# Patient Record
Sex: Female | Born: 2000 | Race: White | Hispanic: No | Marital: Single | Smoking: Never smoker
Health system: Southern US, Community
[De-identification: ages and names within clinical notes are randomized; demographics above are authoritative.]

---
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
## 2018-06-19 IMAGING — US US ABDOMEN LIMITED
1 series · 14 of 25 positions shown · non-contrast
Comparison: None.

CLINICAL DATA: 15 y/o F; epigastric abdominal pain with nausea,
vomiting, and diarrhea. Elevated lipase.

EXAM:
US ABDOMEN LIMITED - RIGHT UPPER QUADRANT

[Series 1: us abdomen limited · 0.15mm/px · 14 of 39 slices shown]
[im 1/39]
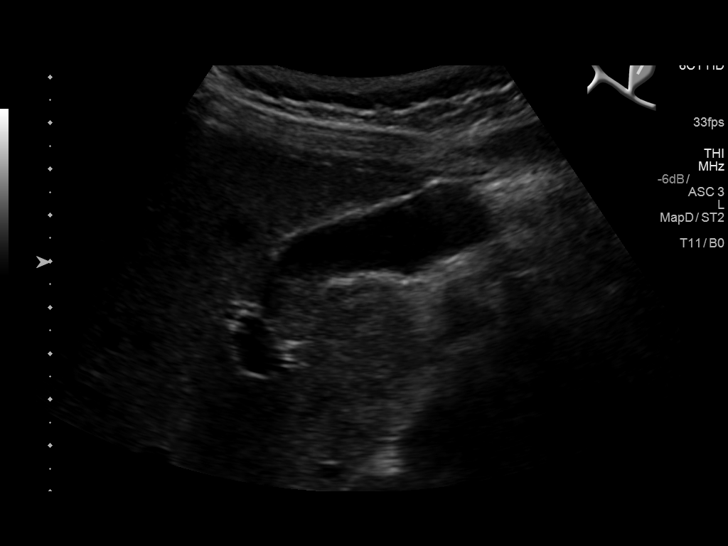
[im 4/39]
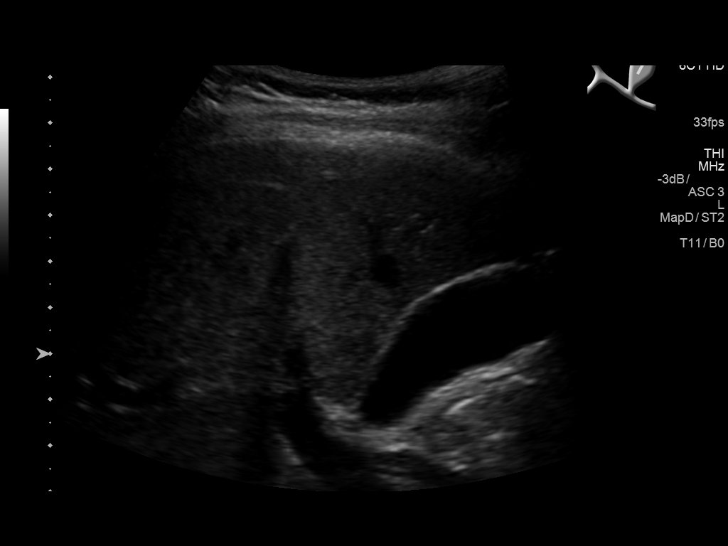
[im 7/39]
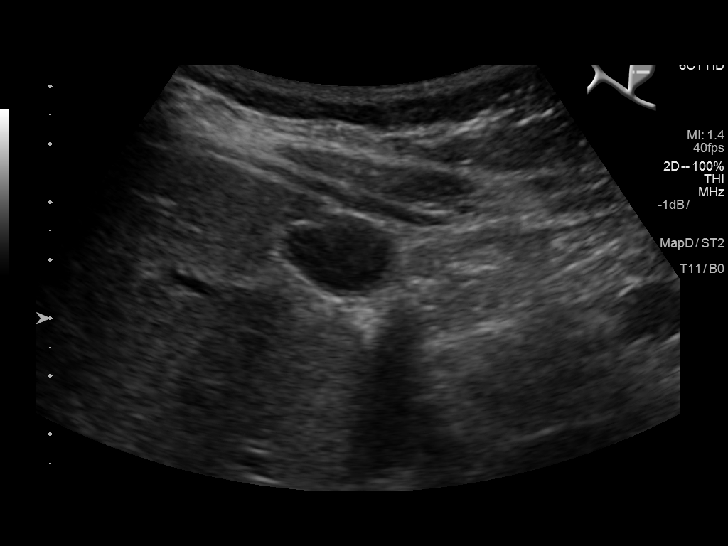
[im 10/39]
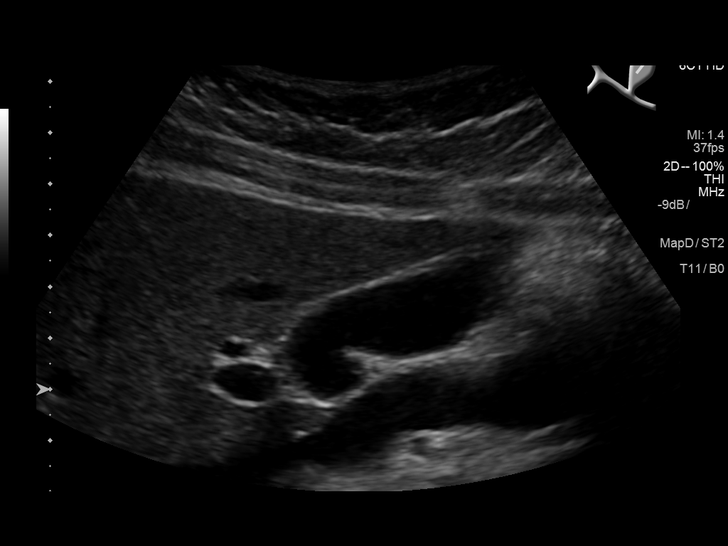
[im 13/39]
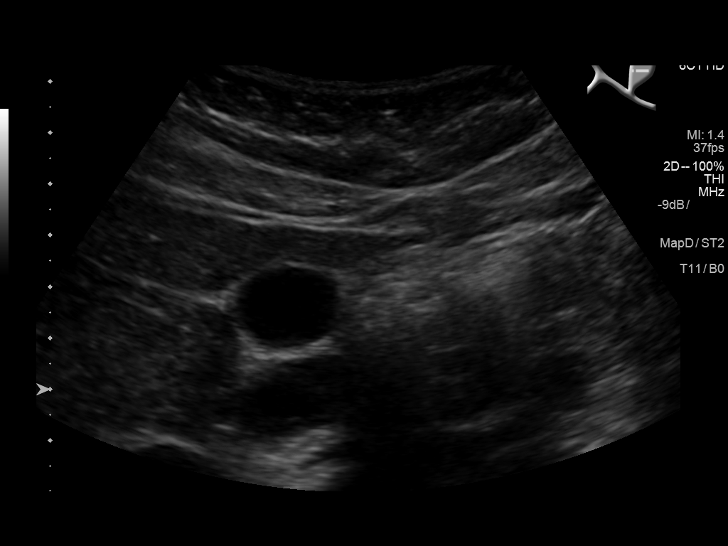
[im 15/39]
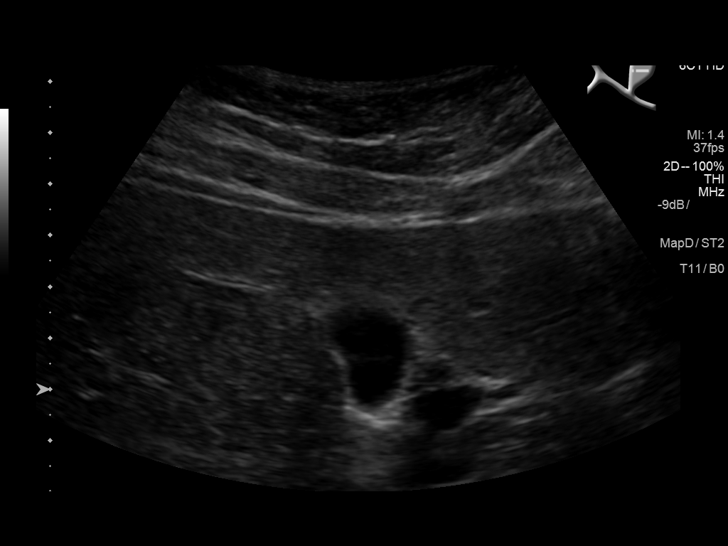
[im 18/39]
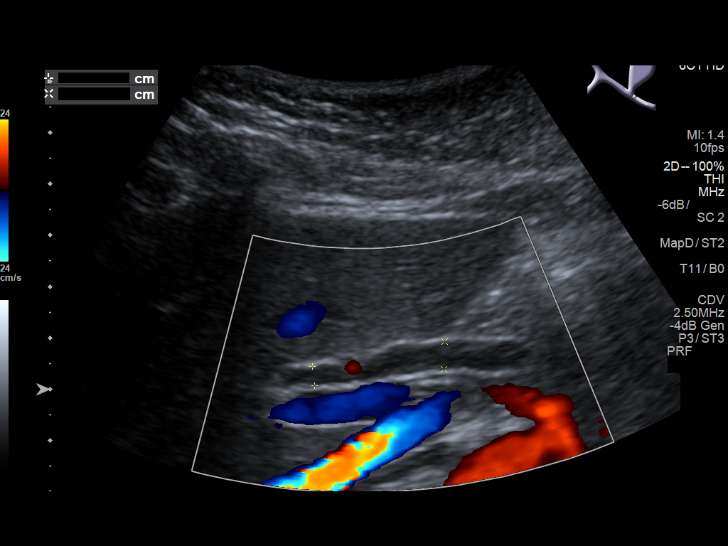
[im 21/39]
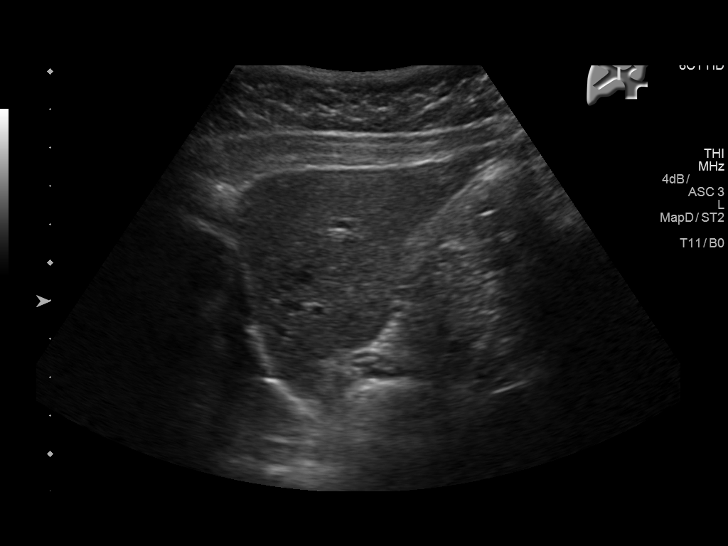
[im 24/39]
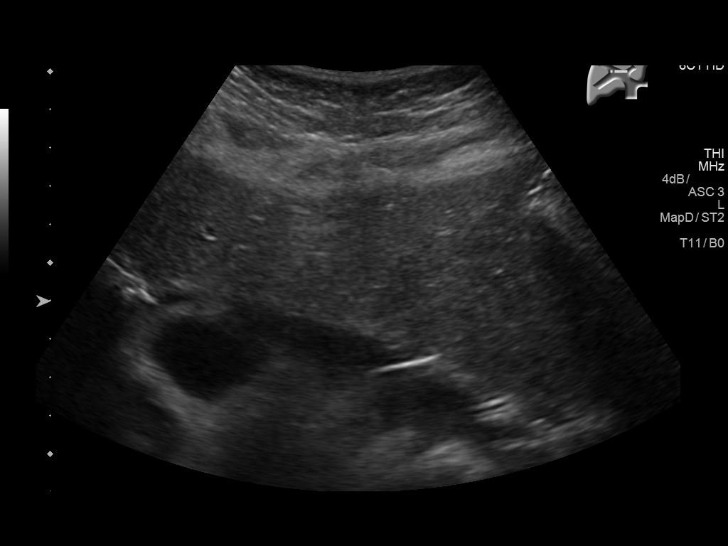
[im 26/39]
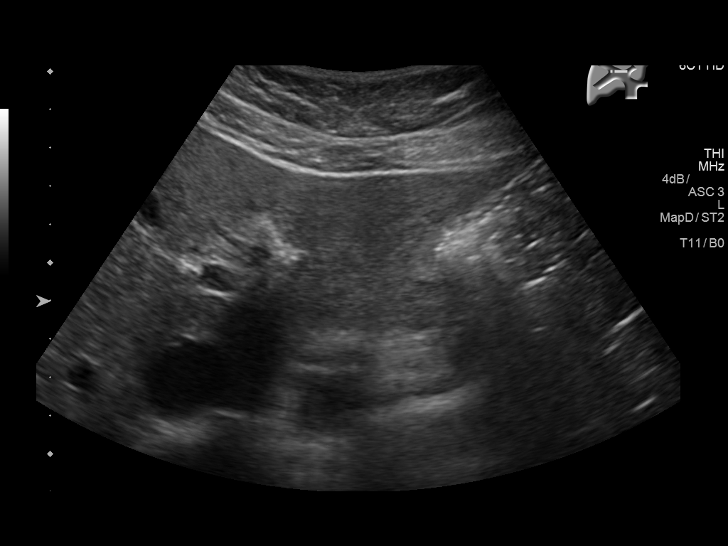
[im 29/39]
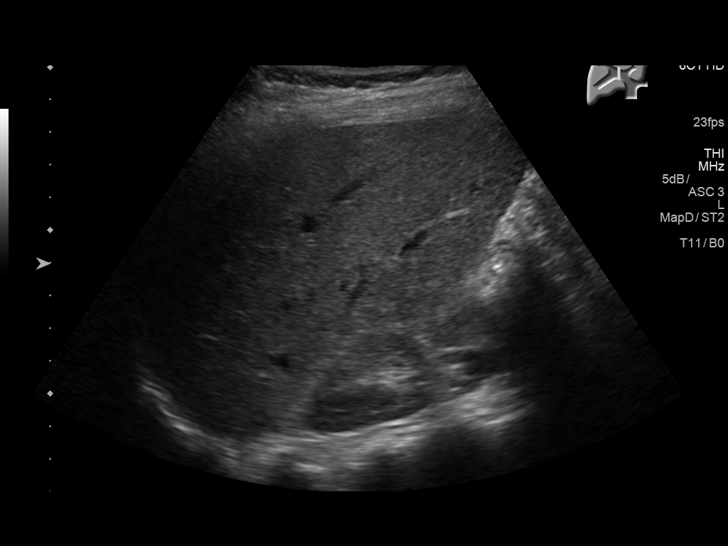
[im 32/39]
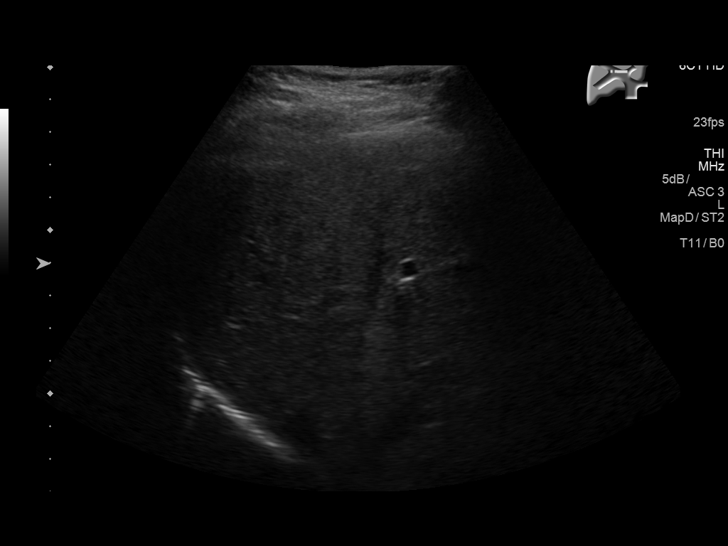
[im 35/39]
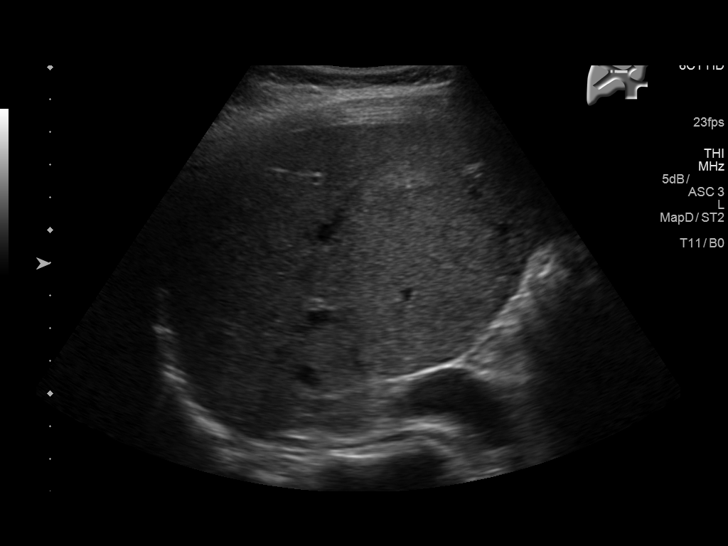
[im 39/39]
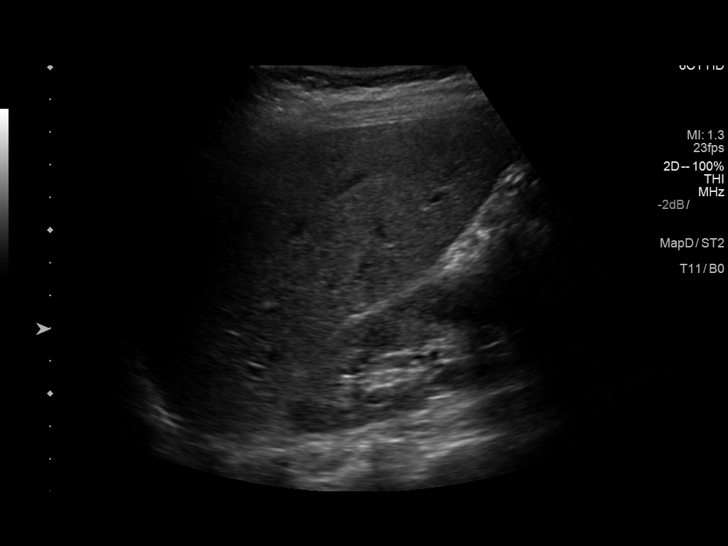

[14 of 25 positions shown; findings below may reference images not displayed]

FINDINGS: Gallbladder:

No gallstones or wall thickening visualized. No sonographic Murphy
sign noted by sonographer.

Common bile duct:

Diameter: 5.4 mm

Liver:

No focal lesion identified. Within normal limits in parenchymal
echogenicity.
IMPRESSION: Normal right upper quadrant ultrasound.

By: Sifi Ala M.D.

## 2020-06-25 ENCOUNTER — Ambulatory Visit (LOCAL_COMMUNITY_HEALTH_CENTER): Payer: Medicaid Other

## 2020-06-25 ENCOUNTER — Other Ambulatory Visit: Payer: Self-pay

## 2020-06-25 VITALS — BP 118/76 | Ht 62.0 in | Wt 97.0 lb

## 2020-06-25 DIAGNOSIS — Z3201 Encounter for pregnancy test, result positive: Secondary | ICD-10-CM

## 2020-06-25 LAB — PREGNANCY, URINE: Preg Test, Ur: POSITIVE — AB

## 2020-06-25 MED ORDER — PRENATAL 27-0.8 MG PO TABS
1.0000 | ORAL_TABLET | Freq: Every day | ORAL | 0 refills | Status: AC
Start: 2020-06-25 — End: 2020-10-03

## 2020-06-25 NOTE — Progress Notes (Signed)
UPT positive. Unsure where she plans prenatal care. Local prenatal resource list given. To DSS for Medicaid/preg women. Jerel Shepherd, RN

## 2020-07-17 LAB — OB RESULTS CONSOLE GC/CHLAMYDIA
Chlamydia: NEGATIVE
Gonorrhea: NEGATIVE

## 2020-08-16 LAB — OB RESULTS CONSOLE VARICELLA ZOSTER ANTIBODY, IGG: Varicella: IMMUNE

## 2020-08-16 LAB — OB RESULTS CONSOLE HEPATITIS B SURFACE ANTIGEN: Hepatitis B Surface Ag: NEGATIVE

## 2020-08-16 LAB — OB RESULTS CONSOLE RUBELLA ANTIBODY, IGM: Rubella: IMMUNE

## 2021-01-19 NOTE — L&D Delivery Note (Signed)
Delivery Note  First Stage: Labor onset: 02/12/21 @ 1539 Augmentation: cytotec, pitocin Analgesia Eliezer Lofts intrapartum: epidural SROM at 02/12/21 @ 1539  Second Stage: Complete dilation at 1040 Onset of pushing at 1058 FHR second stage Category II, intermittent late decelerations   Delivery of a viable female infant 02/13/2021 at 1105 by Donato Schultz, CNM delivery of fetal head in OA position with restitution to ROA. No nuchal cord;  right nuchal hand. Anterior then posterior shoulders delivered easily with gentle downward traction. Baby placed on mom's chest, and attended to by peds.  Cord double clamped after cessation of pulsation, cut by FOB Cord blood sample collected   Third Stage: Placenta delivered easily Tomasa Blase intact with 3 VC @ 1116 Placenta disposition: discarded Uterine tone firm with massage, pitocin, and Cytotec / bleeding heavy.  2nd degree right sulcal, left periurethral, and cervical laceration identified. Cervical laceration bleeding briskly. Called for Dr. Feliberto Gottron to repair cervical laceration, see his note. I repaired her 2nd degree right sulcal. Left periurethral hemostatic and approximated, not requiring sutures. Anesthesia for repair: epidural Repair 2-0 Vicryl Quantiative Blood Loss (mL):  Complications: cervical laceration, pre-eclampsia on magnesium, postpartum hemorrhage  STAT CBC ordered and repeat CBC in 4hrs.  Mom to stay in L&D for postpartum magnesium.  Baby to Couplet care / Skin to Skin.  Newborn: Birth Weight: will weigh infant after skin-to-skin Apgar Scores: 8, 9 Feeding planned: breast

## 2021-02-05 LAB — OB RESULTS CONSOLE GBS: GBS: NEGATIVE

## 2021-02-05 LAB — OB RESULTS CONSOLE HIV ANTIBODY (ROUTINE TESTING): HIV: NONREACTIVE

## 2021-02-05 LAB — OB RESULTS CONSOLE RPR: RPR: NONREACTIVE

## 2021-02-12 ENCOUNTER — Inpatient Hospital Stay
Admission: EM | Admit: 2021-02-12 | Discharge: 2021-02-15 | DRG: 768 | Disposition: A | Discharge status: Home / Self Care | Payer: Medicaid Other | Attending: Obstetrics and Gynecology | Admitting: Obstetrics and Gynecology

## 2021-02-12 ENCOUNTER — Encounter: Payer: Self-pay | Admitting: Obstetrics and Gynecology

## 2021-02-12 ENCOUNTER — Other Ambulatory Visit: Payer: Self-pay

## 2021-02-12 DIAGNOSIS — O365930 Maternal care for other known or suspected poor fetal growth, third trimester, not applicable or unspecified: Secondary | ICD-10-CM | POA: Diagnosis present

## 2021-02-12 DIAGNOSIS — O4103X1 Oligohydramnios, third trimester, fetus 1: Secondary | ICD-10-CM | POA: Diagnosis present

## 2021-02-12 DIAGNOSIS — Z20822 Contact with and (suspected) exposure to covid-19: Secondary | ICD-10-CM | POA: Diagnosis present

## 2021-02-12 DIAGNOSIS — Z6791 Unspecified blood type, Rh negative: Secondary | ICD-10-CM

## 2021-02-12 DIAGNOSIS — D500 Iron deficiency anemia secondary to blood loss (chronic): Secondary | ICD-10-CM | POA: Diagnosis not present

## 2021-02-12 DIAGNOSIS — D62 Acute posthemorrhagic anemia: Secondary | ICD-10-CM | POA: Diagnosis not present

## 2021-02-12 DIAGNOSIS — O9081 Anemia of the puerperium: Secondary | ICD-10-CM | POA: Diagnosis not present

## 2021-02-12 DIAGNOSIS — O4103X0 Oligohydramnios, third trimester, not applicable or unspecified: Principal | ICD-10-CM | POA: Diagnosis present

## 2021-02-12 DIAGNOSIS — Z3A39 39 weeks gestation of pregnancy: Secondary | ICD-10-CM

## 2021-02-12 DIAGNOSIS — O1414 Severe pre-eclampsia complicating childbirth: Secondary | ICD-10-CM | POA: Diagnosis present

## 2021-02-12 DIAGNOSIS — O26893 Other specified pregnancy related conditions, third trimester: Secondary | ICD-10-CM | POA: Diagnosis present

## 2021-02-12 DIAGNOSIS — S3763XA Laceration of uterus, initial encounter: Secondary | ICD-10-CM | POA: Diagnosis not present

## 2021-02-12 LAB — COMPREHENSIVE METABOLIC PANEL
ALT: 8 U/L (ref 0–44)
AST: 21 U/L (ref 15–41)
Albumin: 3 g/dL — ABNORMAL LOW (ref 3.5–5.0)
Alkaline Phosphatase: 218 U/L — ABNORMAL HIGH (ref 38–126)
Anion gap: 11 (ref 5–15)
BUN: 6 mg/dL (ref 6–20)
CO2: 23 mmol/L (ref 22–32)
Calcium: 8.6 mg/dL — ABNORMAL LOW (ref 8.9–10.3)
Chloride: 102 mmol/L (ref 98–111)
Creatinine, Ser: 0.51 mg/dL (ref 0.44–1.00)
GFR, Estimated: >60 mL/min (ref >60)
Glucose, Bld: 105 mg/dL — ABNORMAL HIGH (ref 70–99)
Potassium: 3.4 mmol/L — ABNORMAL LOW (ref 3.5–5.1)
Sodium: 136 mmol/L (ref 135–145)
Total Bilirubin: 0.4 mg/dL (ref 0.3–1.2)
Total Protein: 6.7 g/dL (ref 6.5–8.1)

## 2021-02-12 LAB — URINE DRUG SCREEN, QUALITATIVE (ARMC ONLY)
Amphetamines, Ur Screen: NOT DETECTED
Barbiturates, Ur Screen: NOT DETECTED
Benzodiazepine, Ur Scrn: NOT DETECTED
Cannabinoid 50 Ng, Ur ~~LOC~~: POSITIVE — AB
Cocaine Metabolite,Ur ~~LOC~~: NOT DETECTED
MDMA (Ecstasy)Ur Screen: NOT DETECTED
Methadone Scn, Ur: NOT DETECTED
Opiate, Ur Screen: NOT DETECTED
Phencyclidine (PCP) Ur S: NOT DETECTED
Tricyclic, Ur Screen: NOT DETECTED

## 2021-02-12 LAB — PROTEIN / CREATININE RATIO, URINE
Creatinine, Urine: 87 mg/dL
Protein Creatinine Ratio: 0.3 mg/mg{Cre} — ABNORMAL HIGH (ref 0.00–0.15)
Total Protein, Urine: 26 mg/dL

## 2021-02-12 LAB — CBC
HCT: 30.7 % — ABNORMAL LOW (ref 36.0–46.0)
Hemoglobin: 10.7 g/dL — ABNORMAL LOW (ref 12.0–15.0)
MCH: 33.1 pg (ref 26.0–34.0)
MCHC: 34.9 g/dL (ref 30.0–36.0)
MCV: 95 fL (ref 80.0–100.0)
Platelets: 261 10*3/uL (ref 150–400)
RBC: 3.23 MIL/uL — ABNORMAL LOW (ref 3.87–5.11)
RDW: 12.7 % (ref 11.5–15.5)
WBC: 12.4 10*3/uL — ABNORMAL HIGH (ref 4.0–10.5)
nRBC: 0 % (ref 0.0–0.2)

## 2021-02-12 LAB — RESP PANEL BY RT-PCR (FLU A&B, COVID) ARPGX2
Influenza A by PCR: NEGATIVE
Influenza B by PCR: NEGATIVE
SARS Coronavirus 2 by RT PCR: NEGATIVE

## 2021-02-12 LAB — ABO/RH: ABO/RH(D): O NEG

## 2021-02-12 MED ORDER — AMMONIA AROMATIC IN INHA
RESPIRATORY_TRACT | Status: AC
  Filled 2021-02-12: qty 10

## 2021-02-12 MED ORDER — OXYTOCIN 10 UNIT/ML IJ SOLN
INTRAMUSCULAR | Status: AC
  Filled 2021-02-12: qty 2

## 2021-02-12 MED ORDER — OXYTOCIN BOLUS FROM INFUSION
333.0000 mL | Freq: Once | INTRAVENOUS | Status: AC
  Administered 2021-02-13: 333 mL via INTRAVENOUS

## 2021-02-12 MED ORDER — LABETALOL HCL 5 MG/ML IV SOLN: 80.0000 mg | INTRAVENOUS | Status: DC | PRN

## 2021-02-12 MED ORDER — OXYTOCIN-SODIUM CHLORIDE 30-0.9 UT/500ML-% IV SOLN
2.5000 [IU]/h | INTRAVENOUS | Status: DC
  Administered 2021-02-13: 2.5 [IU]/h via INTRAVENOUS
  Filled 2021-02-12: qty 500

## 2021-02-12 MED ORDER — ACETAMINOPHEN 325 MG PO TABS: 650.0000 mg | ORAL_TABLET | ORAL | Status: DC | PRN

## 2021-02-12 MED ORDER — LABETALOL HCL 5 MG/ML IV SOLN: 20.0000 mg | INTRAVENOUS | Status: DC | PRN

## 2021-02-12 MED ORDER — MISOPROSTOL 200 MCG PO TABS
ORAL_TABLET | ORAL | Status: AC
  Filled 2021-02-12: qty 4

## 2021-02-12 MED ORDER — MISOPROSTOL 25 MCG QUARTER TABLET
ORAL_TABLET | ORAL | Status: AC
  Filled 2021-02-12: qty 1

## 2021-02-12 MED ORDER — HYDRALAZINE HCL 20 MG/ML IJ SOLN: 10.0000 mg | INTRAMUSCULAR | Status: DC | PRN

## 2021-02-12 MED ORDER — LIDOCAINE HCL (PF) 1 % IJ SOLN: 30.0000 mL | INTRAMUSCULAR | Status: DC | PRN

## 2021-02-12 MED ORDER — SOD CITRATE-CITRIC ACID 500-334 MG/5ML PO SOLN: 30.0000 mL | ORAL | Status: DC | PRN

## 2021-02-12 MED ORDER — LABETALOL HCL 5 MG/ML IV SOLN: 40.0000 mg | INTRAVENOUS | Status: DC | PRN

## 2021-02-12 MED ORDER — MAGNESIUM SULFATE BOLUS VIA INFUSION
4.0000 g | Freq: Once | INTRAVENOUS | Status: AC
  Administered 2021-02-12: 20:00:00 4 g via INTRAVENOUS
  Filled 2021-02-12: qty 1000

## 2021-02-12 MED ORDER — LACTATED RINGERS IV SOLN: INTRAVENOUS | Status: DC

## 2021-02-12 MED ORDER — MAGNESIUM SULFATE 40 GM/1000ML IV SOLN
2.0000 g/h | INTRAVENOUS | Status: DC
  Administered 2021-02-12 – 2021-02-13 (×2): 2 g/h via INTRAVENOUS
  Filled 2021-02-12 (×2): qty 1000

## 2021-02-12 MED ORDER — FENTANYL CITRATE (PF) 100 MCG/2ML IJ SOLN
50.0000 ug | INTRAMUSCULAR | Status: DC | PRN
  Administered 2021-02-12: 20:00:00 50 ug via INTRAVENOUS
  Filled 2021-02-12: qty 2

## 2021-02-12 MED ORDER — TERBUTALINE SULFATE 1 MG/ML IJ SOLN: 0.2500 mg | Freq: Once | INTRAMUSCULAR | Status: DC | PRN

## 2021-02-12 MED ORDER — FENTANYL CITRATE (PF) 100 MCG/2ML IJ SOLN
INTRAMUSCULAR | Status: AC
  Administered 2021-02-13: 50 ug via INTRAVENOUS
  Filled 2021-02-12: qty 2

## 2021-02-12 MED ORDER — OXYTOCIN-SODIUM CHLORIDE 30-0.9 UT/500ML-% IV SOLN
1.0000 m[IU]/min | INTRAVENOUS | Status: DC
  Administered 2021-02-12 (×2): 2 m[IU]/min via INTRAVENOUS
  Filled 2021-02-12: qty 500

## 2021-02-12 MED ORDER — BUTORPHANOL TARTRATE 1 MG/ML IJ SOLN
INTRAMUSCULAR | Status: AC
  Filled 2021-02-12: qty 1

## 2021-02-12 MED ORDER — MISOPROSTOL 25 MCG QUARTER TABLET
25.0000 ug | ORAL_TABLET | ORAL | Status: DC | PRN
  Administered 2021-02-12: 14:00:00 25 ug via VAGINAL

## 2021-02-12 MED ORDER — LACTATED RINGERS IV SOLN
500.0000 mL | INTRAVENOUS | Status: DC | PRN
  Administered 2021-02-12 – 2021-02-13 (×2): 500 mL via INTRAVENOUS

## 2021-02-12 MED ORDER — ONDANSETRON HCL 4 MG/2ML IJ SOLN
4.0000 mg | Freq: Four times a day (QID) | INTRAMUSCULAR | Status: DC | PRN
  Administered 2021-02-12 – 2021-02-13 (×3): 4 mg via INTRAVENOUS
  Filled 2021-02-12 (×3): qty 2

## 2021-02-12 NOTE — Progress Notes (Signed)
Labor Progress Note  Destiny Chandler is a 21 y.o. G1P0000 at [redacted]w[redacted]d by LMP admitted for IOL for Oligohydramnios  Subjective: Pt has requested IV pain medication  Objective: BP (!) 146/88 (BP Location: Right Arm)    Pulse 78    Temp 98.7 F (37.1 C) (Oral)    Resp 18    Ht 5\' 2"  (1.575 m)    Wt 62.6 kg    LMP 05/10/2020 (Exact Date)    BMI 25.24 kg/m   Fetal Assessment: FHT:  FHR: 130 bpm, variability: moderate,  accelerations:  Present,  decelerations:  Absent Category/reactivity:  Category I UC:   regular, every 2-3 minutes SVE:    Dilation: 1.5cm  Effacement: 60%  Station:  -3  Consistency:   Position: posterior  Membrane status: SROM at 1545 Amniotic color: Clear  Labs: Lab Results  Component Value Date   WBC 12.4 (H) 02/12/2021   HGB 10.7 (L) 02/12/2021   HCT 30.7 (L) 02/12/2021   MCV 95.0 02/12/2021   PLT 261 02/12/2021    Assessment / Plan: Induction of labor due to oligohydramnios 1420 59mcg Cytotec PV given 1545 SROM 1819 1/50/-2 1940 IUPC placed 1955 Mag Sulfate started 2014 1.5/60/-3 2015 43mcg Fentanyl given 2136 Pitocin started - MVUs at 130-170   Labor: Progressing normally Preeclampsia:  Magnesium Sulfate, 103-146 / 88-90, no signs or symptoms of toxicity Fetal Wellbeing:  Category I Pain Control:  IV pain meds I/D:   Afebrile, GBS neg, SROM x 5 hrs Anticipated MOD:  NSVD  Clydene Laming, CNM 02/12/2021, 9:44 PM

## 2021-02-12 NOTE — Progress Notes (Signed)
Labor Progress Note  Destiny Chandler is a 21 y.o. G1P0000 at [redacted]w[redacted]d by LMP admitted for IOL for Oligohydramnios  Subjective: Pt reports stronger contractions now and is having to breath through them.  Objective: BP (!) 139/94 (BP Location: Right Arm)    Pulse 71    Temp 98.1 F (36.7 C) (Oral)    Resp 18    Ht 5\' 2"  (1.575 m)    Wt 62.6 kg    LMP 05/10/2020 (Exact Date)    BMI 25.24 kg/m   Fetal Assessment: FHT:  FHR: 135 bpm, variability: moderate,  accelerations:  Present,  decelerations:  Absent Category/reactivity:  Category I UC:   regular, every 1-2 minutes SVE:    Dilation: 1cm  Effacement: 50%  Station:  -2  Consistency: medium  Position: posterior  Membrane status: SROM at 1545 Amniotic color: Clear  Labs: Lab Results  Component Value Date   WBC 12.4 (H) 02/12/2021   HGB 10.7 (L) 02/12/2021   HCT 30.7 (L) 02/12/2021   MCV 95.0 02/12/2021   PLT 261 02/12/2021    Assessment / Plan: Induction of labor due to oligohydramnios 1420 02/14/2021 Cytotec PV given 1545 SROM C/w Dr. - will start mag IUPC placed   Labor:  Pitocin was not started d/t contraction pattern; IUPC placed and will monitor labor progress Preeclampsia:  Magnesium started, 139-160 / 81-95, PCR 300 all other labs WNL, no signs or symptoms of toxicity Fetal Wellbeing:  Category I Pain Control:  Labor support without medications I/D:   Afebrile, GBS neg, SROM x 3 hrs Anticipated MOD:  NSVD  Feliberto Gottron, CNM 02/12/2021, 7:44 PM

## 2021-02-12 NOTE — Progress Notes (Signed)
Labor Progress Note  Destiny Chandler is a 21 y.o. G1P0000 at [redacted]w[redacted]d by LMP admitted for IOL for Oligohydramnios  Subjective: Pt denies pain.  Objective: BP (!) 149/85 (BP Location: Left Arm)    Pulse 75    Temp 98 F (36.7 C) (Oral)    Resp 18    Ht 5\' 2"  (1.575 m)    Wt 62.6 kg    LMP 05/10/2020 (Exact Date)    BMI 25.24 kg/m   Fetal Assessment: FHT:  FHR: 135 bpm, variability: moderate,  accelerations:  Present,  decelerations:  Absent Category/reactivity:  Category I UC:   regular, every 2-3 minutes, mild SVE:    Dilation: 1cm  Effacement: 50%  Station:  -2  Consistency: medium  Position: posterior  Membrane status: SROM at 1545 Amniotic color: Clear  Labs: Lab Results  Component Value Date   WBC 12.4 (H) 02/12/2021   HGB 10.7 (L) 02/12/2021   HCT 30.7 (L) 02/12/2021   MCV 95.0 02/12/2021   PLT 261 02/12/2021    Assessment / Plan: Induction of labor due to oligohydramnios 1420 68mcg Cytotec PV given  Labor:  Pitocin started Preeclampsia:  142-157 / 81-94, labs collected, no signs or symptoms of toxicity Fetal Wellbeing:  Category I Pain Control:  Labor support without medications I/D:   Afebrile, GBS neg, SROM x 2 hrs Anticipated MOD:  NSVD  Clydene Laming, CNM 02/12/2021, 6:57 PM

## 2021-02-12 NOTE — Progress Notes (Signed)
Just spoke with RN - soon after starting pitocin pt became tachysystole with a variable decel noted on FHT, baby recovered quickly to baseline with moderate variability. At time of decel RN turned pitocin off.  Will plan to give bolus and restart pitocin in 1 hr if reasonable.  Haroldine Laws 02/12/2021 10:00 PM

## 2021-02-12 NOTE — H&P (Signed)
OB History & Physical   History of Present Illness:  Chief Complaint:   HPI:  [place] B Noecker is a 21 y.o. G1P0000 female at [redacted]w[redacted]d dated by LMP.  She presents to L&D for IOL due to oligohydramnios.  AFI 3.69cm <3% and MVP 2.45cm  She reports:  -active fetal movement -no leakage of fluid -no vaginal bleeding -no contractions  Pregnancy Issues: 1. Rh Neg 2. Anemia, Hgb 10.4 at 30wks 3. Tobacco use in pregnancy   Maternal Medical History:   Past Medical History:  Diagnosis Date   Ovarian cyst 2019   resolved without treatment    Past Surgical History:  Procedure Laterality Date   TOOTH EXTRACTION  2012    Allergies  Allergen Reactions   Codeine Hives    Prior to Admission medications   Medication Sig Start Date End Date Taking? Authorizing Provider  ferrous sulfate 325 (65 FE) MG EC tablet Take 325 mg by mouth 3 (three) times daily with meals.   Yes [provider]  Prenatal Vit-Fe Fumarate-FA (PRENATAL MULTIVITAMIN) TABS tablet Take 1 tablet by mouth daily at 12 noon.   Yes [provider]  cephALEXin (KEFLEX) 500 MG capsule Take 1 capsule (500 mg total) by mouth 3 (three) times daily. Patient not taking: Reported on 06/25/2020 03/25/16   Loleta Rose, MD  ondansetron [facility]) 4 MG tablet Take 1-2 tabs by mouth every 8 hours as needed for nausea/vomiting Patient not taking: Reported on 06/25/2020 03/25/16   Loleta Rose, MD     Prenatal care site: [facility]   Social History: She  reports that she has never smoked. She has never used smokeless tobacco. She reports that she does not currently use alcohol. She reports that she does not use drugs.  Family History: family history is not on file.   Review of Systems: A full review of systems was performed and negative except as noted in the HPI.    Physical Exam:  Vital Signs: BP (!) 143/81 (BP Location: Left Arm)    Pulse 86    Temp 98.8 F (37.1 C) (Oral)    Resp 18    Ht 5\' 2"  (1.575 m)     Wt 62.6 kg    LMP 05/10/2020 (Exact Date)    BMI 25.24 kg/m   General:   alert and cooperative  Skin:  normal  Neurologic:    Alert & oriented x 3  Lungs:    Nl effort  Heart:   regular rate and rhythm  Abdomen:  soft, non-tender; bowel sounds normal; no masses,  no organomegaly  Extremities: : non-tender, symmetric, no edema bilaterally.      Results for orders placed or performed during the hospital encounter of 02/12/21 (from the past 24 hour(s))  CBC     Status: Abnormal   Collection Time: 02/12/21  1:38 PM  Result Value Ref Range   WBC 12.4 (H) 4.0 - 10.5 K/uL   RBC 3.23 (L) 3.87 - 5.11 MIL/uL   Hemoglobin 10.7 (L) 12.0 - 15.0 g/dL   HCT 02/14/21 (L) 78.2 - 95.6 %   MCV 95.0 80.0 - 100.0 fL   MCH 33.1 26.0 - 34.0 pg   MCHC 34.9 30.0 - 36.0 g/dL   RDW 21.3 08.6 - 57.8 %   Platelets 261 150 - 400 K/uL   nRBC 0.0 0.0 - 0.2 %    Pertinent Results:  Prenatal Labs: Blood type/Rh O neg  Antibody screen neg  Rubella Immune  Varicella  Immune  RPR NR  HBsAg Neg  HIV NR  GC neg  Chlamydia neg  Genetic screening negative  1 hour GTT 112  3 hour GTT   GBS Neg   FHT: FHR: 135 bpm, variability: moderate,  accelerations:  Present,  decelerations:  Absent Category/reactivity:  Category I TOCO: none SVE: Dilation: Fingertip / Effacement (%): 50 / Station: -2     Assessment:  [place] B Zacher is a 21 y.o. G89P0000 female at [redacted]w[redacted]d with oligohydramnios.   Plan:  1. Admit to Labor & Delivery; consents reviewed and obtained  2. Fetal Well being  - Fetal Tracing: Cat I - GBS neg - Presentation: vtx confirmed by SVE   3. Routine OB: - Prenatal labs reviewed, as above - Rh neg - CBC & T&S on admit - Clear fluids, IVF  4. Induction of Labor -  Contractions by external toco in place -  Plan for induction with Cytotec -  Plan for continuous fetal monitoring  -  Maternal pain control as desired: IVPM, nitrous, regional anesthesia - Anticipate vaginal delivery  5. Post Partum  Planning: - Infant feeding: Breastfeeding - Contraception: POPs - Tdap: given 11/25/20  - Flu: Declined  Arisha Gervais, CNM 02/12/2021 2:28 PM

## 2021-02-13 ENCOUNTER — Encounter: Payer: Self-pay | Admitting: Obstetrics and Gynecology

## 2021-02-13 ENCOUNTER — Inpatient Hospital Stay: Payer: Medicaid Other | Admitting: Anesthesiology

## 2021-02-13 LAB — CBC
HCT: 27.4 % — ABNORMAL LOW (ref 36.0–46.0)
HCT: 24.2 % — ABNORMAL LOW (ref 36.0–46.0)
HCT: 22.9 % — ABNORMAL LOW (ref 36.0–46.0)
Hemoglobin: 9.5 g/dL — ABNORMAL LOW (ref 12.0–15.0)
Hemoglobin: 8.3 g/dL — ABNORMAL LOW (ref 12.0–15.0)
Hemoglobin: 7.9 g/dL — ABNORMAL LOW (ref 12.0–15.0)
MCH: 33.8 pg (ref 26.0–34.0)
MCH: 33.7 pg (ref 26.0–34.0)
MCH: 33.5 pg (ref 26.0–34.0)
MCHC: 34.7 g/dL (ref 30.0–36.0)
MCHC: 34.3 g/dL (ref 30.0–36.0)
MCHC: 34.5 g/dL (ref 30.0–36.0)
MCV: 97.5 fL (ref 80.0–100.0)
MCV: 98.4 fL (ref 80.0–100.0)
MCV: 97 fL (ref 80.0–100.0)
Platelets: 262 10*3/uL (ref 150–400)
Platelets: 201 10*3/uL (ref 150–400)
Platelets: 248 10*3/uL (ref 150–400)
RBC: 2.81 MIL/uL — ABNORMAL LOW (ref 3.87–5.11)
RBC: 2.46 MIL/uL — ABNORMAL LOW (ref 3.87–5.11)
RBC: 2.36 MIL/uL — ABNORMAL LOW (ref 3.87–5.11)
RDW: 12.7 % (ref 11.5–15.5)
RDW: 12.9 % (ref 11.5–15.5)
RDW: 12.7 % (ref 11.5–15.5)
WBC: 28.1 10*3/uL — ABNORMAL HIGH (ref 4.0–10.5)
WBC: 24.8 10*3/uL — ABNORMAL HIGH (ref 4.0–10.5)
WBC: 21.5 10*3/uL — ABNORMAL HIGH (ref 4.0–10.5)
nRBC: 0 % (ref 0.0–0.2)
nRBC: 0 % (ref 0.0–0.2)
nRBC: 0 % (ref 0.0–0.2)

## 2021-02-13 LAB — RPR: RPR Ser Ql: NONREACTIVE

## 2021-02-13 LAB — MAGNESIUM: Magnesium: 5.5 mg/dL — ABNORMAL HIGH (ref 1.7–2.4)

## 2021-02-13 MED ORDER — FENTANYL-BUPIVACAINE-NACL 0.5-0.125-0.9 MG/250ML-% EP SOLN
EPIDURAL | Status: AC
  Filled 2021-02-13: qty 250

## 2021-02-13 MED ORDER — SODIUM CHLORIDE 0.9 % IV SOLN
300.0000 mg | Freq: Once | INTRAVENOUS | Status: DC
  Filled 2021-02-13: qty 15

## 2021-02-13 MED ORDER — FERROUS SULFATE 325 (65 FE) MG PO TABS
325.0000 mg | ORAL_TABLET | Freq: Two times a day (BID) | ORAL | Status: DC
  Administered 2021-02-14 – 2021-02-15 (×3): 325 mg via ORAL
  Filled 2021-02-13 (×3): qty 1

## 2021-02-13 MED ORDER — MISOPROSTOL 200 MCG PO TABS
800.0000 ug | ORAL_TABLET | Freq: Once | ORAL | Status: AC
  Administered 2021-02-13: 800 ug via RECTAL

## 2021-02-13 MED ORDER — ACETAMINOPHEN 325 MG PO TABS
650.0000 mg | ORAL_TABLET | ORAL | Status: DC | PRN
  Administered 2021-02-13 – 2021-02-14 (×3): 650 mg via ORAL
  Filled 2021-02-13 (×3): qty 2

## 2021-02-13 MED ORDER — BENZOCAINE-MENTHOL 20-0.5 % EX AERO
1.0000 "application " | INHALATION_SPRAY | CUTANEOUS | Status: DC | PRN
  Administered 2021-02-13 – 2021-02-15 (×2): 1 via TOPICAL
  Filled 2021-02-13 (×2): qty 56

## 2021-02-13 MED ORDER — PHENYLEPHRINE 40 MCG/ML (10ML) SYRINGE FOR IV PUSH (FOR BLOOD PRESSURE SUPPORT): 80.0000 ug | PREFILLED_SYRINGE | INTRAVENOUS | Status: DC | PRN

## 2021-02-13 MED ORDER — TRANEXAMIC ACID-NACL 1000-0.7 MG/100ML-% IV SOLN
INTRAVENOUS | Status: AC
  Administered 2021-02-13: 1000 mg via INTRAVENOUS
  Filled 2021-02-13: qty 100

## 2021-02-13 MED ORDER — FENTANYL-BUPIVACAINE-NACL 0.5-0.125-0.9 MG/250ML-% EP SOLN
EPIDURAL | Status: DC | PRN
  Administered 2021-02-13: 12 mL/h via EPIDURAL

## 2021-02-13 MED ORDER — SODIUM CHLORIDE 0.9 % IV SOLN
INTRAVENOUS | Status: DC | PRN
  Administered 2021-02-13 (×2): 5 mL via EPIDURAL

## 2021-02-13 MED ORDER — LACTATED RINGERS IV BOLUS
500.0000 mL | Freq: Once | INTRAVENOUS | Status: AC
  Administered 2021-02-13: 500 mL via INTRAVENOUS

## 2021-02-13 MED ORDER — LACTATED RINGERS IV SOLN: INTRAVENOUS | Status: DC

## 2021-02-13 MED ORDER — ACETAMINOPHEN 325 MG PO TABS
650.0000 mg | ORAL_TABLET | Freq: Once | ORAL | Status: AC
  Administered 2021-02-13: 650 mg via ORAL
  Filled 2021-02-13: qty 2

## 2021-02-13 MED ORDER — COCONUT OIL OIL
1.0000 "application " | TOPICAL_OIL | Status: DC | PRN
  Filled 2021-02-13: qty 120

## 2021-02-13 MED ORDER — PRENATAL MULTIVITAMIN CH
1.0000 | ORAL_TABLET | Freq: Every day | ORAL | Status: DC
  Administered 2021-02-14: 1 via ORAL

## 2021-02-13 MED ORDER — ONDANSETRON HCL 4 MG/2ML IJ SOLN: 4.0000 mg | INTRAMUSCULAR | Status: DC | PRN

## 2021-02-13 MED ORDER — WITCH HAZEL-GLYCERIN EX PADS
1.0000 "application " | MEDICATED_PAD | CUTANEOUS | Status: DC | PRN
  Administered 2021-02-13 – 2021-02-15 (×2): 1 via TOPICAL
  Filled 2021-02-13 (×2): qty 100

## 2021-02-13 MED ORDER — SODIUM CHLORIDE 0.9 % IV SOLN
300.0000 mg | Freq: Once | INTRAVENOUS | Status: AC
  Administered 2021-02-13: 300 mg via INTRAVENOUS
  Filled 2021-02-13: qty 15

## 2021-02-13 MED ORDER — SIMETHICONE 80 MG PO CHEW: 80.0000 mg | CHEWABLE_TABLET | ORAL | Status: DC | PRN

## 2021-02-13 MED ORDER — ONDANSETRON HCL 4 MG PO TABS: 4.0000 mg | ORAL_TABLET | ORAL | Status: DC | PRN

## 2021-02-13 MED ORDER — ZOLPIDEM TARTRATE 5 MG PO TABS: 5.0000 mg | ORAL_TABLET | Freq: Every evening | ORAL | Status: DC | PRN

## 2021-02-13 MED ORDER — TETANUS-DIPHTH-ACELL PERTUSSIS 5-2.5-18.5 LF-MCG/0.5 IM SUSY
0.5000 mL | PREFILLED_SYRINGE | Freq: Once | INTRAMUSCULAR | Status: DC
  Filled 2021-02-13: qty 0.5

## 2021-02-13 MED ORDER — IBUPROFEN 600 MG PO TABS
600.0000 mg | ORAL_TABLET | Freq: Four times a day (QID) | ORAL | Status: DC
  Administered 2021-02-13 – 2021-02-15 (×7): 600 mg via ORAL
  Filled 2021-02-13 (×6): qty 1

## 2021-02-13 MED ORDER — DIPHENHYDRAMINE HCL 25 MG PO CAPS: 25.0000 mg | ORAL_CAPSULE | Freq: Four times a day (QID) | ORAL | Status: DC | PRN

## 2021-02-13 MED ORDER — EPHEDRINE 5 MG/ML INJ: 10.0000 mg | INTRAVENOUS | Status: DC | PRN

## 2021-02-13 MED ORDER — LIDOCAINE HCL (PF) 1 % IJ SOLN
INTRAMUSCULAR | Status: DC | PRN
Start: 2021-02-13 — End: 2021-02-13
  Administered 2021-02-13: 2 mL via SUBCUTANEOUS

## 2021-02-13 MED ORDER — TRANEXAMIC ACID-NACL 1000-0.7 MG/100ML-% IV SOLN: 1000.0000 mg | Freq: Once | INTRAVENOUS | Status: AC

## 2021-02-13 MED ORDER — DIBUCAINE (PERIANAL) 1 % EX OINT
1.0000 "application " | TOPICAL_OINTMENT | CUTANEOUS | Status: DC | PRN
  Administered 2021-02-15: 1 via RECTAL
  Filled 2021-02-13 (×2): qty 28

## 2021-02-13 MED ORDER — LACTATED RINGERS IV SOLN
500.0000 mL | Freq: Once | INTRAVENOUS | Status: AC
  Administered 2021-02-13: 500 mL via INTRAVENOUS

## 2021-02-13 MED ORDER — SODIUM CHLORIDE 0.9 % IV SOLN
12.5000 mg | Freq: Once | INTRAVENOUS | Status: AC
  Administered 2021-02-13: 12.5 mg via INTRAVENOUS
  Filled 2021-02-13: qty 0.5

## 2021-02-13 MED ORDER — DIPHENHYDRAMINE HCL 50 MG/ML IJ SOLN: 12.5000 mg | INTRAMUSCULAR | Status: DC | PRN

## 2021-02-13 MED ORDER — SODIUM CHLORIDE 0.9% IV SOLUTION: Freq: Once | INTRAVENOUS | Status: AC

## 2021-02-13 MED ORDER — CALCIUM GLUCONATE 10 % IV SOLN
INTRAVENOUS | Status: AC
  Filled 2021-02-13: qty 10

## 2021-02-13 MED ORDER — SENNOSIDES-DOCUSATE SODIUM 8.6-50 MG PO TABS
2.0000 | ORAL_TABLET | Freq: Every day | ORAL | Status: DC
  Administered 2021-02-14 – 2021-02-15 (×2): 2 via ORAL
  Filled 2021-02-13 (×2): qty 2

## 2021-02-13 MED ORDER — FENTANYL-BUPIVACAINE-NACL 0.5-0.125-0.9 MG/250ML-% EP SOLN: 12.0000 mL/h | EPIDURAL | Status: DC | PRN

## 2021-02-13 MED ORDER — LIDOCAINE-EPINEPHRINE (PF) 1.5 %-1:200000 IJ SOLN
INTRAMUSCULAR | Status: DC | PRN
  Administered 2021-02-13: 3 mL via EPIDURAL

## 2021-02-13 MED ORDER — FERROUS SULFATE 325 (65 FE) MG PO TABS
325.0000 mg | ORAL_TABLET | Freq: Three times a day (TID) | ORAL | Status: DC
  Administered 2021-02-13: 325 mg via ORAL
  Filled 2021-02-13 (×2): qty 1

## 2021-02-13 NOTE — Progress Notes (Deleted)
Discussed pt's CBC H&H drop from 10.7/30.7 -> 8.3/24.2 along with her feeling weak and tired with Dr. Feliberto Gottron. Asked if he recommends IV iron transfusion vs blood transfusion. Per Dr. Feliberto Gottron, do not give either at this time. Repeat CBC in 4hrs then decide if she needs blood transfusion vs. IV iron transfusion.   Janyce Llanos, CNM 02/13/2021 3:40 PM

## 2021-02-13 NOTE — Discharge Summary (Signed)
Obstetrical Discharge Summary  Patient Name: Destiny Chandler DOB: March 12, 2000 MRN: [id number]  Date of Admission: 02/12/2021 Date of Delivery: 02/13/2021 Delivered by: Donato Schultz, CNM Date of Discharge: 02/15/2021  Primary OB: [facility]  SAY:[id number] last menstrual period was 05/10/2020 (exact date). EDC Estimated Date of Delivery: 02/14/21 Gestational Age at Delivery: [redacted]w[redacted]d   Antepartum complications:  1. Rh Neg 2. Anemia, Hgb 10.4 at 30wks 3. 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 Progressed to 10/100/+3, pushed for and delivered viable female infant. Brisk bright red bleeding noted, pitocin and cytotec given. Cervical laceration identified and repaired by Dr. Feliberto Gottron. 2nd degree right sulcal laceration repaired by CNM. QBL . Date of Delivery: 02/13/2021 Delivered By: Donato Schultz, CNM Delivery Type: spontaneous vaginal delivery Anesthesia: epidural Placenta: spontaneous Laceration: cervical, 2nd degree right sulcal, left periurethral Episiotomy: none Newborn Data: Live born female "Richardson Dopp" Birth Weight:  2810g (6lb 3.1oz) APGAR: 8, 9  Newborn Delivery   Birth date/time: 02/13/2021 11:12:00 Delivery type: Vaginal, Spontaneous     Postpartum Procedures: 1u pRBC and Venofer Edinburgh:  Edinburgh Postnatal Depression Scale Screening Tool 02/15/2021 02/14/2021 02/14/2021  I have been able  to laugh and see the funny side of things. 0 (No Data) (No Data)  I have looked forward with enjoyment to things. 0 - -  I have blamed myself unnecessarily when things went wrong. 2 - -  I have been anxious or worried for no good reason. 2 - -  I have felt scared or panicky for no good reason. 1 - -  Things have been getting on top of me. 1 - -  I have been so unhappy that I have had difficulty sleeping. 0 - -  I have felt sad or miserable. 1 - -  I have been so unhappy that I have been crying. 1 - -  The thought of harming myself has occurred to me. 0 - -  Edinburgh Postnatal Depression Scale Total 8 - -      Post partum course:  Pt received 1u pRBC and a dose of Venofer for PPH. Otherwise, patient had an uncomplicated postpartum course.  By time of discharge on PPD#2 her pain was controlled on oral pain medications; she had appropriate lochia and was ambulating, voiding without difficulty and tolerating regular diet.  She was deemed stable for discharge to home.    Discharge Physical Exam:  BP 136/83 (BP Location: Left Arm)    Pulse 67    Temp 98.2 F (36.8 C) (Oral)    Resp 19    Ht 5\' 2"  (1.575 m)    Wt 62.6 kg    LMP 05/10/2020 (Exact Date)    SpO2 100%    Breastfeeding Unknown    BMI 25.24 kg/m   General: NAD CV: RRR Pulm: CTABL, nl effort ABD: s/nd/nt, fundus firm and below the umbilicus Lochia: moderate Perineum: Min edema and well approximated DVT Evaluation: LE non-ttp, no evidence of DVT on exam.  Hemoglobin  Date Value Ref Range Status  02/14/2021 8.5 (  L) 12.0 - 15.0 g/dL Final   HCT  Date Value Ref Range Status  02/14/2021 24.1 (L) 36.0 - 46.0 % Final    Disposition: stable, discharge to home. Baby Feeding: breastmilk Baby Disposition: home with mom  Rh Immune globulin given: Rhogam given Rubella vaccine given: Immune Varicella vaccine given: Immune Tdap vaccine given in AP or PP setting: given 11/25/20 Flu vaccine given in AP or PP setting: Declined  AP  Contraception: POPs  Prenatal Labs:  Blood type/Rh O neg  Antibody screen neg  Rubella Immune  Varicella Immune  RPR NR  HBsAg Neg  HIV NR  GC neg  Chlamydia neg  Genetic screening negative  1 hour GTT 112  3 hour GTT    GBS Neg     Plan:  Destiny B Capano was discharged to home in good condition. Follow-up appointment with delivering provider in 6 weeks.  Discharge Medications: Allergies as of 02/15/2021       Reactions   Codeine Hives        Medication List     STOP taking these medications    cephALEXin 500 MG capsule Commonly known as: KEFLEX   ondansetron 4 MG tablet Commonly known as: Zofran       TAKE these medications    ferrous sulfate 325 (65 FE) MG EC tablet Take 325 mg by mouth 3 (three) times daily with meals.   prenatal multivitamin Tabs tablet Take 1 tablet by mouth daily at 12 noon.         Follow-up Information     Janyce Llanos, CNM. Schedule an appointment as soon as possible for a visit in 1 week(s).   Specialty: Certified Nurse Midwife Why: for a blood pressure check Then in 6 weeks for your postpartum follow up Contact information: [address] [place] [address] [phone]                 Signed:  Cyril Mourning, CNM 02/15/2021 10:19 AM

## 2021-02-13 NOTE — Anesthesia Preprocedure Evaluation (Signed)
Anesthesia Evaluation  Patient identified by MRN, date of birth, ID band Patient awake    Reviewed: Allergy & Precautions, NPO status , Patient's Chart, lab work & pertinent test results  Airway Mallampati: II  TM Distance: >3 FB Neck ROM: full    Dental no notable dental hx.    Pulmonary neg pulmonary ROS,    Pulmonary exam normal        Cardiovascular Exercise Tolerance: Good negative cardio ROS Normal cardiovascular exam     Neuro/Psych    GI/Hepatic negative GI ROS,   Endo/Other    Renal/GU   negative genitourinary   Musculoskeletal   Abdominal   Peds  Hematology negative hematology ROS (+)   Anesthesia Other Findings Preeclampsia on Magnesium  Past Medical History: 2019: Ovarian cyst     Comment:  resolved without treatment  Past Surgical History: 2012: TOOTH EXTRACTION  BMI    Body Mass Index: 25.24 kg/m      Reproductive/Obstetrics (+) Pregnancy                             Anesthesia Physical Anesthesia Plan  ASA: 3  Anesthesia Plan: Epidural   Post-op Pain Management:    Induction:   PONV Risk Score and Plan:   Airway Management Planned:   Additional Equipment:   Intra-op Plan:   Post-operative Plan:   Informed Consent: I have reviewed the patients History and Physical, chart, labs and discussed the procedure including the risks, benefits and alternatives for the proposed anesthesia with the patient or authorized representative who has indicated his/her understanding and acceptance.       Plan Discussed with: Anesthesiologist  Anesthesia Plan Comments:         Anesthesia Quick Evaluation

## 2021-02-13 NOTE — Anesthesia Procedure Notes (Addendum)
Epidural Patient location during procedure: OB Start time: 02/13/2021 4:26 AM End time: 02/13/2021 4:46 AM  Staffing Anesthesiologist: Iran Ouch, MD Performed: anesthesiologist   Preanesthetic Checklist Completed: patient identified, IV checked, site marked, risks and benefits discussed, surgical consent, monitors and equipment checked, pre-op evaluation and timeout performed  Epidural Patient position: sitting Prep: ChloraPrep Patient monitoring: heart rate, continuous pulse ox and blood pressure Approach: midline Location: L3-L4 Injection technique: LOR saline  Needle:  Needle type: Tuohy  Needle gauge: 18 G Needle length: 9 cm Needle insertion depth: 6 cm Catheter type: closed end Catheter size: 20 Guage Catheter at skin depth: 10 cm Test dose: negative and 1.5% lidocaine with Epi 1:200 K  Assessment Events: blood not aspirated and injection not painful  Additional Notes Left paresthesia with initial thread of catheter. Catheter removed and the paresthesia quickly resolved. Rotated Tuohy 90 degrees to the right and advanced catheter with normal pressure.Reason for block:procedure for pain

## 2021-02-13 NOTE — Progress Notes (Signed)
Patient ID: Destiny Chandler, female   DOB: April 29, 2000, 21 y.o.   MRN: [id number] Destiny Chandler called by after rapid second stage delivery for possible cervical laceration .  3 cm laceration at 7 oclock cx . Repaired with 00 vicryl . Clots removed form LUS . Bleeding slowed .  Placenta appeared intact . I will administer 1000 TXA iv .  CNM will continue with laceration repair . Monitor for additional bleeding  SEE OP note

## 2021-02-13 NOTE — Progress Notes (Signed)
Labor Progress Note  Destiny Chandler is a 21 y.o. G1P0000 at [redacted]w[redacted]d by LMP admitted for IOL for Oligohydramnios  Subjective: Pt now has epidural and feeling more comfortable, however she has been vomiting  Objective: BP 130/80    Pulse 79    Temp 97.7 F (36.5 C) (Oral)    Resp 20    Ht 5\' 2"  (1.575 m)    Wt 62.6 kg    LMP 05/10/2020 (Exact Date)    SpO2 99%    BMI 25.24 kg/m   Fetal Assessment: FHT:  FHR: 130 bpm, variability: moderate,  accelerations:  Present,  decelerations:  Absent Category/reactivity:  Category I UC:   regular, every 2-3 minutes SVE:    Dilation: 2cm  Effacement: 70%  Station:  -2  Consistency: medium  Position: posterior  Membrane status: SROM at 1545 Amniotic color: Clear  Labs: Lab Results  Component Value Date   WBC 12.4 (H) 02/12/2021   HGB 10.7 (L) 02/12/2021   HCT 30.7 (L) 02/12/2021   MCV 95.0 02/12/2021   PLT 261 02/12/2021    Assessment / Plan: Induction of labor due to oligohydramnios 1420 02/14/2021 Cytotec PV given 1545 SROM 1819 1/50/-2 1940 IUPC placed 1955 Mag Sulfate started 2014 1.5/60/-3 2015 04-13-1986 Fentanyl given 2354 Pitocin started 0500 Epidural  Current rate of pitocin 53mU with MVUs 160-225 Phenergan ordered for nausea   Labor: Progressing normally Preeclampsia:  Magnesium Sulfate, 126-151 / 70-97, no signs or symptoms of toxicity, output 154mL/hr Fetal Wellbeing:  Category I Pain Control:  IV pain meds I/D:   Afebrile, GBS neg, SROM x 13 hrs Anticipated MOD:  NSVD  Jenifer E Owin Vignola, CNM 02/13/2021, 5:15 AM

## 2021-02-14 LAB — CBC
HCT: 24.1 % — ABNORMAL LOW (ref 36.0–46.0)
Hemoglobin: 8.5 g/dL — ABNORMAL LOW (ref 12.0–15.0)
MCH: 33.1 pg (ref 26.0–34.0)
MCHC: 35.3 g/dL (ref 30.0–36.0)
MCV: 93.8 fL (ref 80.0–100.0)
Platelets: 245 10*3/uL (ref 150–400)
RBC: 2.57 MIL/uL — ABNORMAL LOW (ref 3.87–5.11)
RDW: 13.5 % (ref 11.5–15.5)
WBC: 19.7 10*3/uL — ABNORMAL HIGH (ref 4.0–10.5)
nRBC: 0 % (ref 0.0–0.2)

## 2021-02-14 LAB — FETAL SCREEN: Fetal Screen: NEGATIVE

## 2021-02-14 MED ORDER — RHO D IMMUNE GLOBULIN 1500 UNIT/2ML IJ SOSY
300.0000 ug | PREFILLED_SYRINGE | Freq: Once | INTRAMUSCULAR | Status: AC
  Administered 2021-02-14: 300 ug via INTRAVENOUS
  Filled 2021-02-14: qty 2

## 2021-02-14 NOTE — Progress Notes (Signed)
Postpartum Day  1  Subjective: no complaints, up ad lib, voiding, and tolerating PO.  Feeling much better overall  Doing well, no concerns. Ambulating without difficulty, pain managed with PO meds, tolerating regular diet, and voiding without difficulty.   No fever/chills, chest pain, shortness of breath, nausea/vomiting, or leg pain. No nipple or breast pain. No headache, visual changes, or RUQ/epigastric pain.  Objective: BP 133/69 (BP Location: Right Arm)    Pulse 81    Temp 98 F (36.7 C)    Resp 18    Ht 5\' 2"  (1.575 m)    Wt 62.6 kg    LMP 05/10/2020 (Exact Date)    SpO2 99%    Breastfeeding Unknown    BMI 25.24 kg/m   Vitals:   02/13/21 1833 02/13/21 1931 02/13/21 2031 02/13/21 2315  BP: 136/84 (!) 144/99 (!) 142/85 133/76   02/13/21 2352 02/14/21 0010 02/14/21 0031 02/14/21 0131  BP: 133/76 132/80 137/83 138/82   02/14/21 0200 02/14/21 0238 02/14/21 0604 02/14/21 0747  BP: 134/82 127/87 121/87 133/69      Physical Exam:  General: alert, cooperative, and no distress Breasts: soft/nontender CV: RRR Pulm: nl effort, CTABL Abdomen: soft, non-tender, active bowel sounds Uterine Fundus: firm Perineum: minimal edema, repair well approximated Lochia: appropriate DVT Evaluation: No evidence of DVT seen on physical exam.  Recent Labs    02/13/21 2105 02/14/21 0627  HGB 7.9* 8.5*  HCT 22.9* 24.1*  WBC 21.5* 19.7*  PLT 248 245    Assessment/Plan: 20 y.o. G1P1001 postpartum day # 1  -Continue routine postpartum care -Lactation consult PRN for breastfeeding  -Acute blood loss anemia - hemodynamically stable and asymptomatic; s/p 1 uPRBC and venofer; start PO ferrous sulfate BID with stool softeners  -Immunization status:   all immunizations up to date -Continue to monitor blood pressures.  Have not required antihypertensives yet but we discussed the potential to start one before discharge    Disposition: Continue inpatient postpartum care    LOS: 2 days   02/16/21, Gustavo Lah 02/14/2021, 9:19 AM   ----- 02/16/2021  Certified Nurse Midwife [facility]

## 2021-02-14 NOTE — TOC Initial Note (Signed)
Transition of Care [facility]) - Initial/Assessment Note    Patient Details  Name: Destiny Chandler MRN: [id number] Date of Birth: 17-Jan-2001  [facility]) CM/SW Contact:    Erskine Cellar, RN Phone Number: 02/14/2021, 3:58 PM  Clinical Narrative:                 Spoke to mother with infant and patients mother present. Patient reports she lives with her mother who is her main support person but her boyfriend/FOB is involved and an excellent support person. Patient is planning to solely breastfeed and is engaged with [facility] services. Has scheduled 6 weeks off of workand is working with FOB to select pediatrician. Has all needed equipment including car seat. Discussed PPD and resources if needed. Mother aware of (+) UDS for infant and need for CPS report.   LVMM for CPS report.         Patient Goals and CMS Choice        Expected Discharge Plan and Services                                                Prior Living Arrangements/Services                       Activities of Daily Living Home Assistive Devices/Equipment: None ADL Screening (condition at time of admission) Patient's cognitive ability adequate to safely complete daily activities?: Yes Is the patient deaf or have difficulty hearing?: No Does the patient have difficulty seeing, even when wearing glasses/contacts?: No Does the patient have difficulty concentrating, remembering, or making decisions?: No Patient able to express need for assistance with ADLs?: Yes Does the patient have difficulty dressing or bathing?: No Independently performs ADLs?: Yes (appropriate for developmental age) Does the patient have difficulty walking or climbing stairs?: No Weakness of Legs: None Weakness of Arms/Hands: None  Permission Sought/Granted                  Emotional Assessment              Admission diagnosis:  Oligohydramnios antepartum, third trimester, fetus 1 [O41.03X1] Patient Active  Problem List   Diagnosis Date Noted   Oligohydramnios antepartum, third trimester, fetus 1 02/12/2021   PCP:  Patient, No Pcp Per (Inactive) Pharmacy:   [id number] [place], [place] - [address] [place] [address] Phone: [phone] Fax: [phone]     Social Determinants of Health (SDOH) Interventions    Readmission Risk Interventions No flowsheet data found.

## 2021-02-14 NOTE — Anesthesia Postprocedure Evaluation (Signed)
Anesthesia Post Note  Patient: Destiny Chandler  Procedure(s) Performed: AN AD HOC LABOR EPIDURAL  Patient location during evaluation: Mother Baby Anesthesia Type: Epidural Level of consciousness: awake, awake and alert, oriented and patient cooperative Pain management: pain level controlled Vital Signs Assessment: post-procedure vital signs reviewed and stable Respiratory status: spontaneous breathing Cardiovascular status: stable Postop Assessment: no headache, no backache, patient able to bend at knees, adequate PO intake and able to ambulate Anesthetic complications: no Comments: Pt stated no problems.  No headache able to walk.  No headache.     No notable events documented.   Last Vitals:  Vitals:   02/14/21 0604 02/14/21 0747  BP: 121/87 133/69  Pulse: (!) 54 81  Resp: 15 18  Temp: 36.7 C 36.7 C  SpO2: 98% 99%    Last Pain:  Vitals:   02/14/21 0604  TempSrc: Oral  PainSc:                  Lyn Records

## 2021-02-14 NOTE — Lactation Note (Signed)
This note was copied from a baby's chart. Lactation Consultation Note  Patient Name: Destiny Chandler Wendell [id number] Date: 02/14/2021 Reason for consult: Follow-up assessment;Primapara;Term Age:23 hours  Maternal Data Has patient been taught Hand Expression?: Yes Does the patient have breastfeeding experience prior to this delivery?: No  Feeding Mother's Current Feeding Choice: Breast Milk BAby had just fed 20 min on left breast, baby had attempted at 1400 post circ, but sleepy, latched easily to right breast in cradle hold,  nursing well x 10+ min, some swallows noted, mom easily expresses colostrum LATCH Score Latch: Grasps breast easily, tongue down, lips flanged, rhythmical sucking.  Audible Swallowing: Spontaneous and intermittent  Type of Nipple: Everted at rest and after stimulation  Comfort (Breast/Nipple): Soft / non-tender  Hold (Positioning): Assistance needed to correctly position infant at breast and maintain latch.  LATCH Score: 9   Lactation Tools Discussed/Used  LC name and no written on white board  Interventions Interventions: Breast feeding basics reviewed;Skin to skin;Assisted with latch;Hand express;Adjust position;Support pillows;Education (lanolin given with instruction in use)  Discharge Pump: Personal WIC Program: Yes  Consult Status Consult Status: PRN    Dyann Kief 02/14/2021, 4:18 PM

## 2021-02-14 NOTE — Op Note (Signed)
NAME: Destiny Chandler, Destiny Chandler. MEDICAL RECORD NO: [id number] ACCOUNT NO: [id number] DATE OF BIRTH: [birth year] FACILITY: [facility] LOCATION: [facility] PHYSICIAN: Boykin Nearing, MD  Operative Report   DATE OF PROCEDURE: 02/13/2021   PREOPERATIVE DIAGNOSIS:   1.  Status post rapid vaginal delivery. 2.  Postpartum hemorrhage. 3.  A 3 cm cervical laceration.  POSTOPERATIVE DIAGNOSIS:   1.  Status post rapid vaginal delivery. 2.  Postpartum hemorrhage. 3.  A 3 cm cervical laceration.  PROCEDURE:  Repair of 3 cm cervical laceration.  SURGEON:  Boykin Nearing, MD  ASSISTANT:  Lucrezia Europe, certified nurse midwife.  INDICATIONS:  A 21 year old gravida 1, para 0, patient was admitted the day prior for induction for intrauterine growth restriction.  The patient in second stage delivered an infant rapidly and it was uncontrolled.  Lucrezia Europe, certified nurse  midwife called for my assistance after delivery and continued uterine bleeding.  DESCRIPTION OF PROCEDURE:  After evaluation, it appeared that there was a 3 cm cervical laceration at the 7 o'clock position.  2-0 Vicryl suture was used.  Cervix was grasped at 6 o'clock and 9 o'clock with ring forceps to identify the defect.  A 2-0  Vicryl suture was used to close the laceration in a running nonlocking fashion.  This area then appeared hemostatic.  Digital exam of the lower uterine segment revealed 50 mL clots removed.  Bleeding slowed thereafter.  Evaluation of the placenta  appeared to be intact.  No trailing membranes noted at the cervix.  Bleeding slowed and after the repair 1000 mg TXA was administered intravenously.     SUJ D: 02/13/2021 12:03:26 pm T: 02/14/2021 7:52:00 am  JOB: [id number]

## 2021-02-15 DIAGNOSIS — D500 Iron deficiency anemia secondary to blood loss (chronic): Secondary | ICD-10-CM | POA: Diagnosis not present

## 2021-02-15 DIAGNOSIS — S3763XA Laceration of uterus, initial encounter: Secondary | ICD-10-CM | POA: Diagnosis not present

## 2021-02-15 LAB — TYPE AND SCREEN
ABO/RH(D): O NEG
Antibody Screen: POSITIVE
Unit division: 0

## 2021-02-15 LAB — BPAM RBC
Blood Product Expiration Date: 202302222359
ISSUE DATE / TIME: 202301262337
Unit Type and Rh: 9500

## 2021-02-15 LAB — RHOGAM INJECTION: Unit division: 0

## 2021-02-15 NOTE — Discharge Instructions (Addendum)
Discharge Instructions:   Follow-up Appointment:  If there are any new medications, they have been ordered and will be available for pickup at the listed pharmacy on your way home from the hospital.   Call office if you have any of the following: headache, visual changes, fever >101.0 F, chills, shortness of breath, breast concerns, excessive vaginal bleeding, incision drainage or problems, leg pain or redness, depression or any other concerns. If you have vaginal discharge with an odor, let your doctor know.   It is normal to bleed for up to 6 weeks. You should not soak through more than 1 pad in 1 hour. If you have a blood clot larger than your fist with continued bleeding, call your doctor.   Activity: Do not lift > 10 lbs for 6 weeks (do not lift anything heavier than your baby). No intercourse, tampons, swimming pools, hot tubs, baths (only showers) for 6 weeks.  No driving for 1-2 weeks. Continue prenatal vitamin, especially if breastfeeding. Increase calories and fluids (water) while breastfeeding.   Your milk will come in, in the next couple of days (right now it is colostrum). You may have a slight fever when your milk comes in, but it should go away on its own.  If it does not, and rises above 101 F please call the doctor. You will also feel achy and your breasts will be firm. They will also start to leak. If you are breastfeeding, continue as you have been and you can pump/express milk for comfort.   If you have too much milk, your breasts can become engorged, which could lead to mastitis. This is an infection of the milk ducts. It can be very painful and you will need to notify your doctor to obtain a prescription for antibiotics. You can also treat it with a shower or hot/cold compress.   For concerns about your baby, please call your pediatrician.  For breastfeeding concerns, the lactation consultant can be reached at [phone].   Postpartum blues (feelings of happy one minute  and sad another minute) are normal for the first few weeks but if it gets worse let your doctor know.   Congratulations! We enjoyed caring for you and your new bundle of joy!  Discharge Instructions:   For concerns about your baby, please call your pediatrician.  For breastfeeding concerns, the lactation consultant can be reached at [phone].   Postpartum blues (feelings of happy one minute and sad another minute) are normal for the first few weeks but if it gets worse let your doctor know.   Congratulations! We enjoyed caring for you and your new bundle of joy!

## 2021-02-15 NOTE — Progress Notes (Signed)
Mother discharged.  Discharge instructions given.  Mother verbalizes understanding.  Transported by auxiliary.  

## 2021-02-18 LAB — PREPARE RBC (CROSSMATCH)
